# Patient Record
Sex: Female | Born: 1990 | Race: White | Hispanic: No | Marital: Single | Smoking: Never smoker
Health system: Southern US, Community
[De-identification: ages and names within clinical notes are randomized; demographics above are authoritative.]

## PROBLEM LIST (undated history)

## (undated) HISTORY — PX: OTHER SURGICAL HISTORY: SHX169

## (undated) HISTORY — PX: SHOULDER ARTHROSCOPY W/ ROTATOR CUFF REPAIR: SHX2400

---
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
## 2012-03-30 ENCOUNTER — Ambulatory Visit: Payer: Self-pay | Admitting: Radiology

## 2015-01-09 IMAGING — CR DG ANKLE COMPLETE 3+V*L*
1 series · 5 of 5 positions shown · non-contrast
Comparison: none

REASON FOR EXAM: lt ankle injury
COMMENTS:

[Series 1: ap · 0.17mm/px · 5 of 5 slices shown]
[im 1/5]
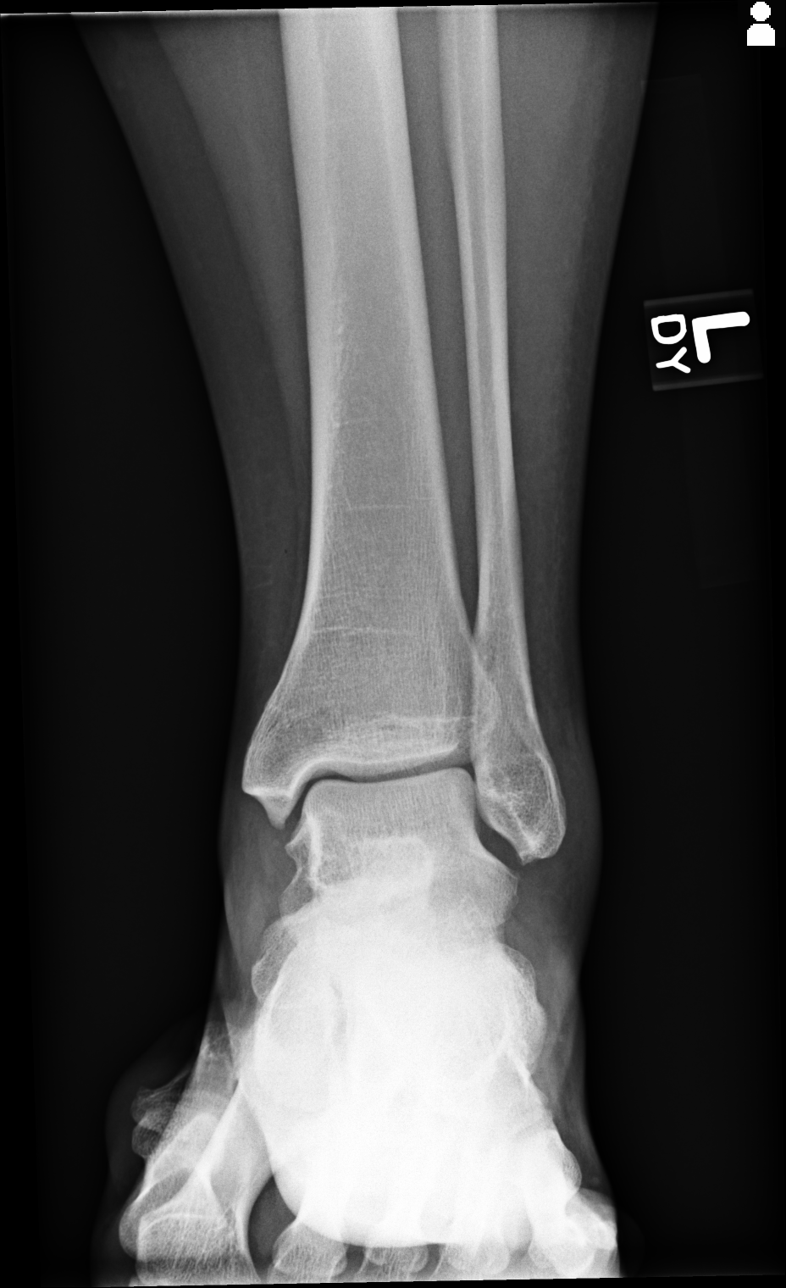
[im 2/5]
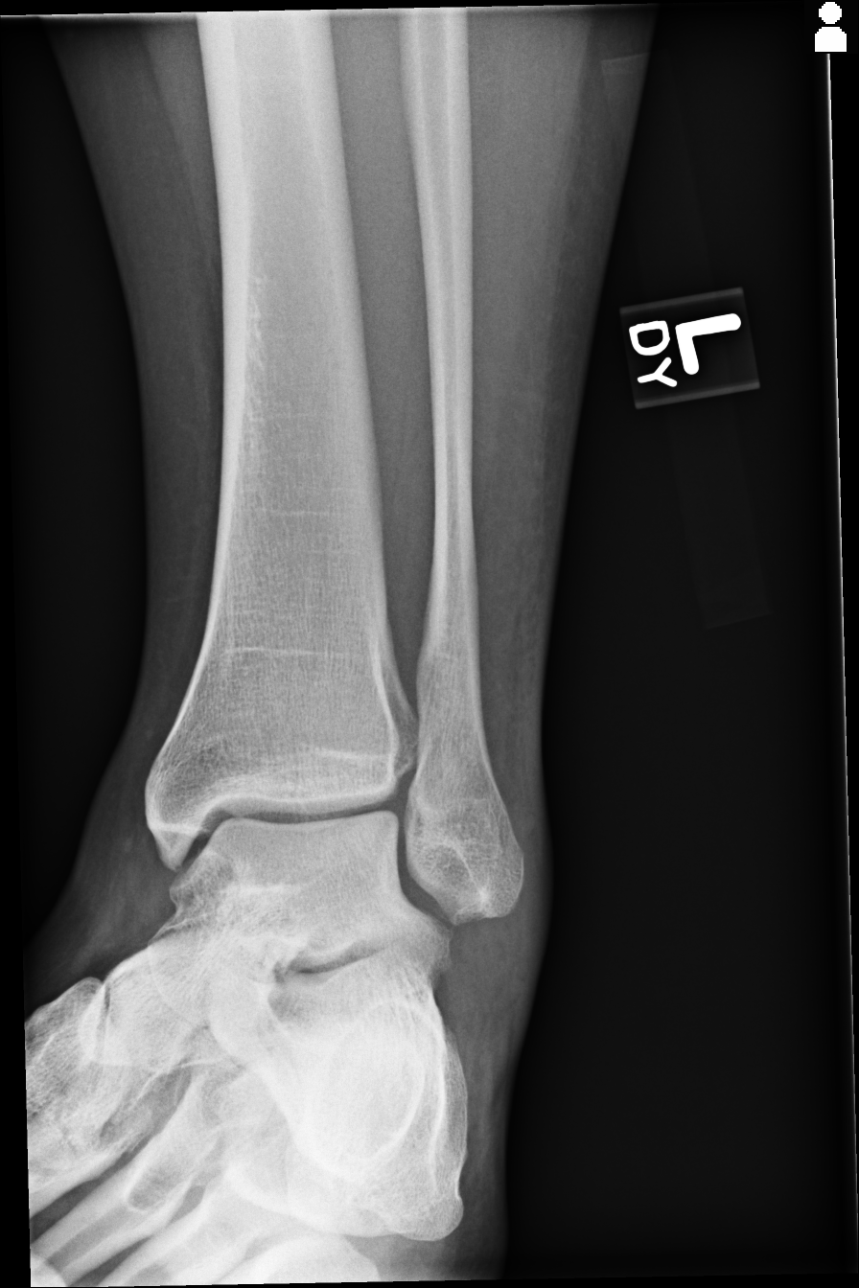
[im 3/5]
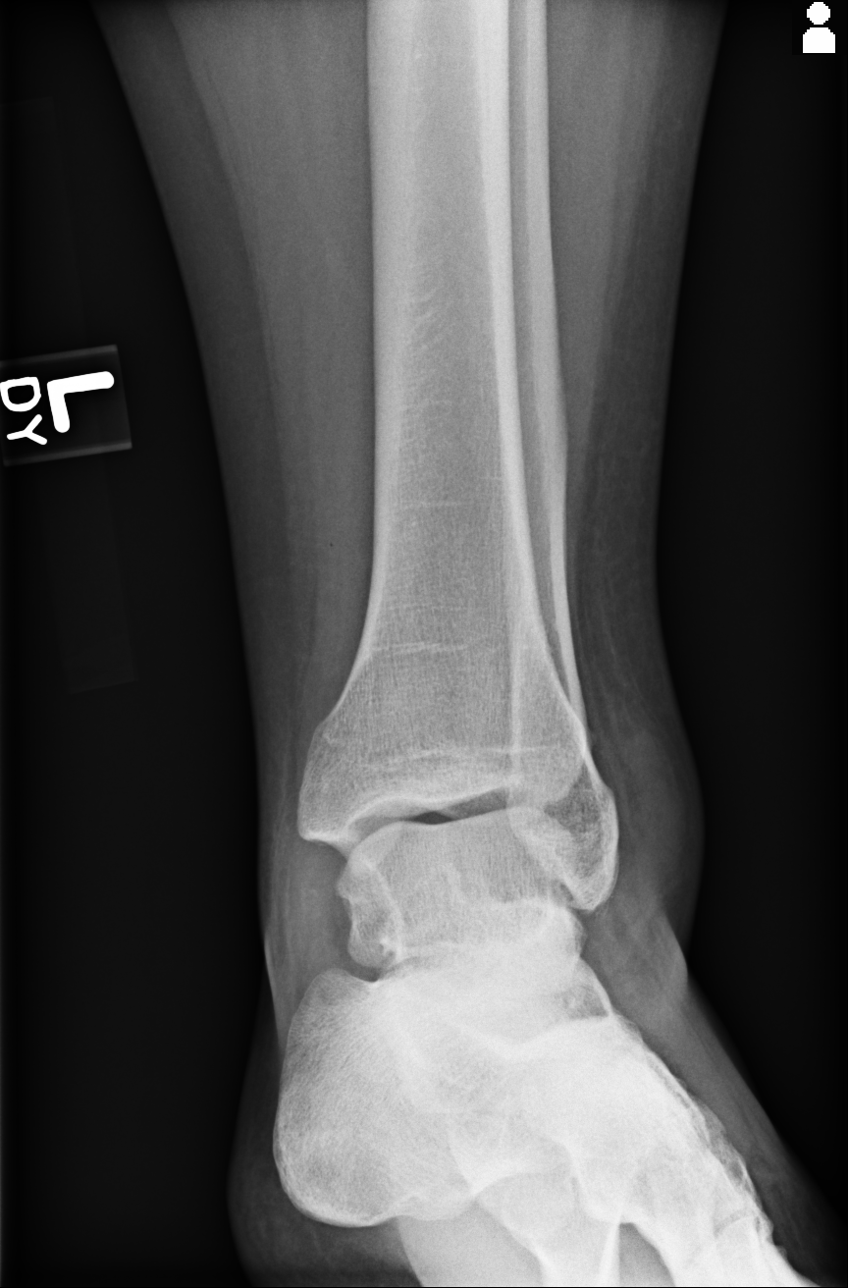
[im 4/5]
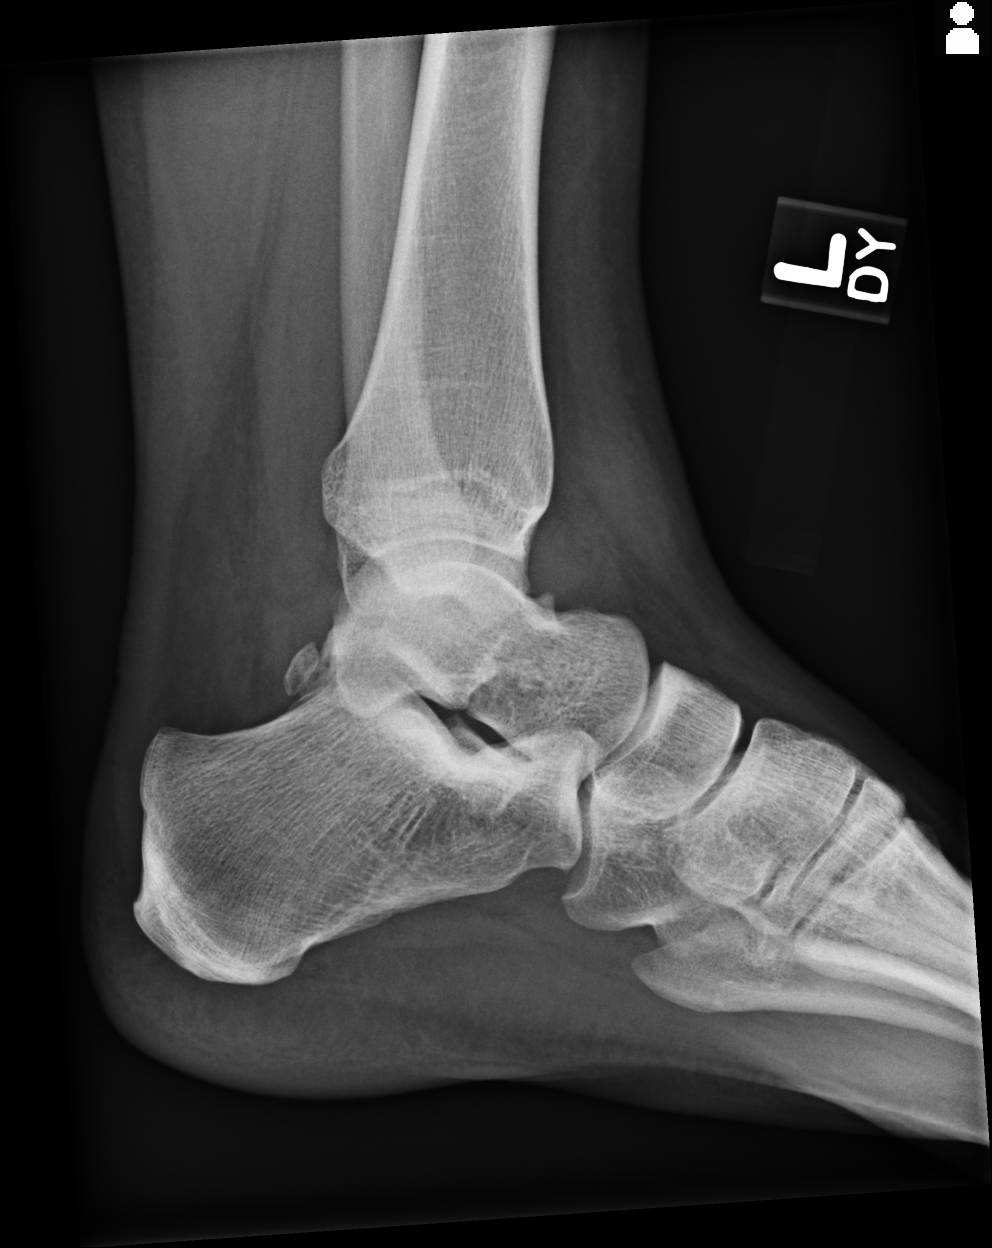
[im 5/5]
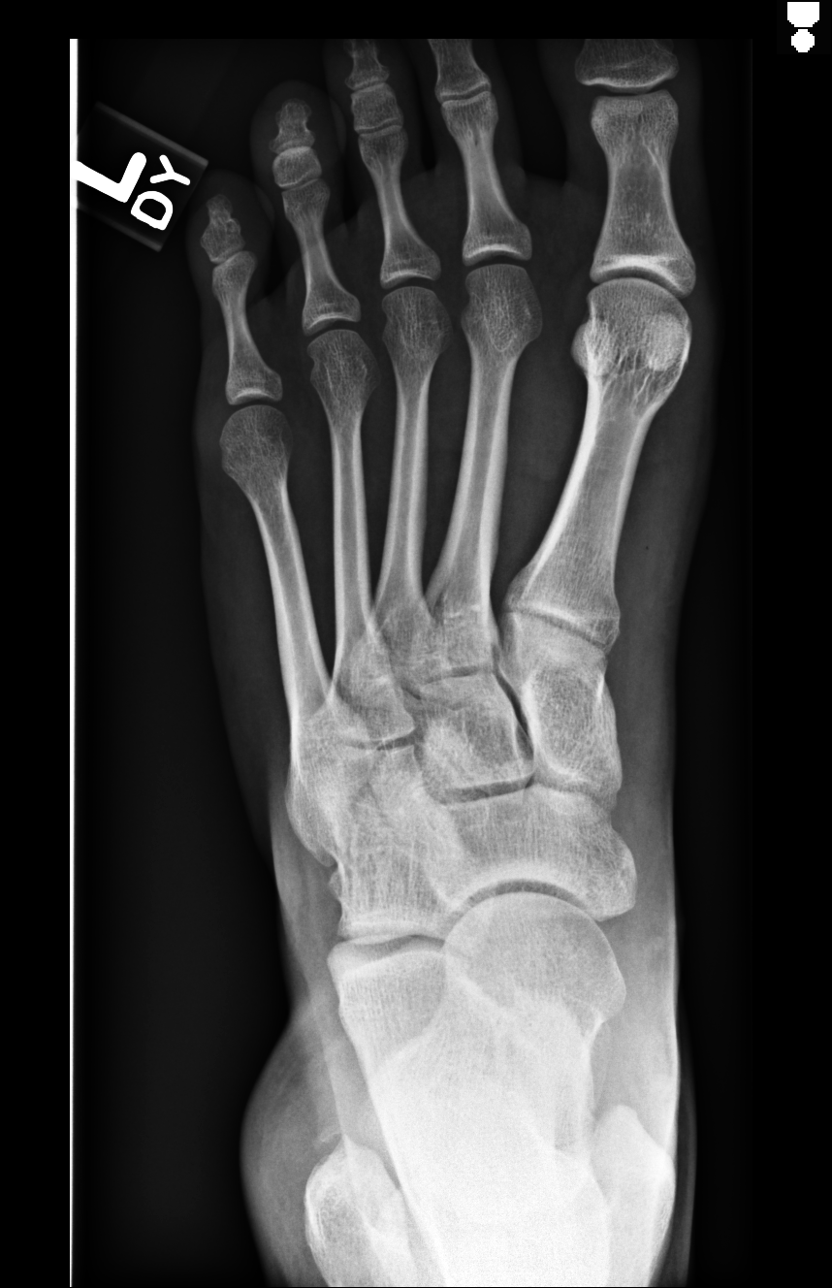

[5 of 5 positions shown; findings below may reference images not displayed]

PROCEDURE:     DXR - DXR ANKLE LEFT COMPLETE  - March 30, 2012  [DATE]

RESULT:     Images of the left ankle show significant soft tissue swelling
laterally. There is a tiny avulsion of the tip of the distal fibula which
may be chronic or acute. The distal tibia and fibula are otherwise
unremarkable. Talus and calcaneus appear normal. The base of the fifth
metatarsal appears intact.
IMPRESSION: Significant soft tissue swelling. Tiny avulsion at the tip
of the distal fibula is of uncertain chronicity.

[REDACTED]

## 2017-12-20 ENCOUNTER — Encounter: Payer: Self-pay | Admitting: Emergency Medicine

## 2017-12-20 ENCOUNTER — Other Ambulatory Visit: Payer: Self-pay

## 2017-12-20 ENCOUNTER — Ambulatory Visit
Admission: EM | Admit: 2017-12-20 | Discharge: 2017-12-20 | Disposition: A | Discharge status: Home / Self Care | Payer: BLUE CROSS/BLUE SHIELD | Attending: Family Medicine | Admitting: Family Medicine

## 2017-12-20 DIAGNOSIS — N390 Urinary tract infection, site not specified: Secondary | ICD-10-CM | POA: Diagnosis not present

## 2017-12-20 DIAGNOSIS — R319 Hematuria, unspecified: Secondary | ICD-10-CM | POA: Diagnosis not present

## 2017-12-20 DIAGNOSIS — R300 Dysuria: Secondary | ICD-10-CM | POA: Diagnosis not present

## 2017-12-20 DIAGNOSIS — R3915 Urgency of urination: Secondary | ICD-10-CM | POA: Diagnosis not present

## 2017-12-20 LAB — WET PREP, GENITAL
CLUE CELLS WET PREP: NONE SEEN
Sperm: NONE SEEN
Trich, Wet Prep: NONE SEEN
YEAST WET PREP: NONE SEEN

## 2017-12-20 LAB — URINALYSIS, COMPLETE (UACMP) WITH MICROSCOPIC
Bacteria, UA: NONE SEEN
Bilirubin Urine: NEGATIVE
Glucose, UA: NEGATIVE mg/dL
KETONES UR: NEGATIVE mg/dL
Nitrite: NEGATIVE
PH: 7 (ref 5.0–8.0)
PROTEIN: NEGATIVE mg/dL
SQUAMOUS EPITHELIAL / LPF: NONE SEEN (ref 0–5)
Specific Gravity, Urine: <1.005 — ABNORMAL LOW (ref 1.005–1.030)

## 2017-12-20 MED ORDER — PHENAZOPYRIDINE HCL 200 MG PO TABS: 200.0000 mg | ORAL_TABLET | Freq: Three times a day (TID) | ORAL | 0 refills | Status: AC

## 2017-12-20 MED ORDER — CEPHALEXIN 500 MG PO CAPS
500.0000 mg | ORAL_CAPSULE | Freq: Two times a day (BID) | ORAL | 0 refills | Status: AC
Start: 2017-12-20 — End: ?

## 2017-12-20 NOTE — ED Triage Notes (Addendum)
Patient in today c/o dysuria, urinary urgency and hematuria since this morning at ~4am. Patient denies fever. Patient denies vaginal discharge, but states she has had a yeast infection before, but has never had a UTI.

## 2017-12-20 NOTE — ED Provider Notes (Signed)
[facility]    CSN: [id number] Arrival date & time: 12/20/17  4132     History   Chief Complaint Chief Complaint  Patient presents with  . Dysuria    HPI Kayla Burch is a 27 y.o. female.   HPI  27 year old female presents with dysuria described as burning, urinary urgency and hematuria at 4:00 this morning. States that she sat on the toilet for long period of time unable to urinate but had the perception that she needed to go.  No fever or chills.  She  had no nausea or vomiting.  She is had no back pain.  Never had a UTI in the past.  She denies any vaginal symptoms.          History reviewed. No pertinent past medical history.  There are no active problems to display for this patient.   Past Surgical History:  Procedure Laterality Date  . SHOULDER ARTHROSCOPY W/ ROTATOR CUFF REPAIR Right   . tubes in ears      OB History   None      Home Medications    Prior to Admission medications   Medication Sig Start Date End Date Taking? Authorizing Provider  cephALEXin (KEFLEX) 500 MG capsule Take 1 capsule (500 mg total) by mouth 2 (two) times daily. 12/20/17   Lutricia Feil, PA-C  phenazopyridine (PYRIDIUM) 200 MG tablet Take 1 tablet (200 mg total) by mouth 3 (three) times daily. 12/20/17   Lutricia Feil, PA-C    Family History Family History  Problem Relation Age of Onset  . Other Mother        unknown medical history  . Atrial fibrillation Father   . Diabetes Maternal Grandfather   . Skin cancer Maternal Grandfather        SCC    Social History Social History   Tobacco Use  . Smoking status: Never Smoker  . Smokeless tobacco: Never Used  Substance Use Topics  . Alcohol use: Yes    Comment: rarely  . Drug use: Never     Allergies   Patient has no known allergies.   Review of Systems Review of Systems  Constitutional: Negative for activity change, appetite change, chills, fatigue and fever.  Genitourinary:  Positive for dysuria, frequency, hematuria and urgency. Negative for vaginal bleeding, vaginal discharge and vaginal pain.  All other systems reviewed and are negative.    Physical Exam Triage Vital Signs ED Triage Vitals  Enc Vitals Group     BP 12/20/17 0943 (!) 130/91     Pulse Rate 12/20/17 0943 91     Resp 12/20/17 0943 16     Temp 12/20/17 0943 98 F (36.7 C)     Temp Source 12/20/17 0943 Oral     SpO2 12/20/17 0943 99 %     Weight 12/20/17 0944 240 lb (108.9 kg)     Height 12/20/17 0944 5\' 10"  (1.778 m)     Head Circumference --      Peak Flow --      Pain Score 12/20/17 0944 0     Pain Loc --      Pain Edu? --      Excl. in GC? --    No data found.  Updated Vital Signs BP (!) 130/91 (BP Location: Left Arm)   Pulse 91   Temp 98 F (36.7 C) (Oral)   Resp 16   Ht 5\' 10"  (1.778 m)   Wt 240 lb (108.9 kg)  LMP 11/29/2017 (Approximate)   SpO2 99%   BMI 34.44 kg/m   Visual Acuity Right Eye Distance:   Left Eye Distance:   Bilateral Distance:    Right Eye Near:   Left Eye Near:    Bilateral Near:     Physical Exam  Constitutional: She is oriented to person, place, and time. She appears well-developed and well-nourished. No distress.  HENT:  Head: Normocephalic.  Eyes: Pupils are equal, round, and reactive to light.  Neck: Normal range of motion.  Genitourinary:  Genitourinary Comments: Patient performed a self swab  Musculoskeletal: Normal range of motion.  Neurological: She is alert and oriented to person, place, and time.  Skin: Skin is warm and dry. She is not diaphoretic.  Psychiatric: She has a normal mood and affect. Her behavior is normal. Judgment and thought content normal.  Nursing note and vitals reviewed.    UC Treatments / Results  Labs (all labs ordered are listed, but only abnormal results are displayed) Labs Reviewed  WET PREP, GENITAL - Abnormal; Notable for the following components:      Result Value   WBC, Wet Prep HPF POC RARE  (*)    All other components within normal limits  URINALYSIS, COMPLETE (UACMP) WITH MICROSCOPIC - Abnormal; Notable for the following components:   Color, Urine STRAW (*)    Specific Gravity, Urine <1.005 (*)    Hgb urine dipstick MODERATE (*)    Leukocytes, UA LARGE (*)    All other components within normal limits  URINE CULTURE    EKG None  Radiology No results found.  Procedures Procedures (including critical care time)  Medications Ordered in UC Medications - No data to display  Initial Impression / Assessment and Plan / UC Course  I have reviewed the triage vital signs and the nursing notes.  Pertinent labs & imaging results that were available during my care of the patient were reviewed by me and considered in my medical decision making (see chart for details).     Place patient on Keflex/ Pyridium.  Culture the urine which will be available in 48 hours.  Increase her fluid intake. Final Clinical Impressions(s) / UC Diagnoses   Final diagnoses:  Lower urinary tract infectious disease   Discharge Instructions   None    ED Prescriptions    Medication Sig Dispense Auth. Provider   cephALEXin (KEFLEX) 500 MG capsule Take 1 capsule (500 mg total) by mouth 2 (two) times daily. 10 capsule Ovid Curd P, PA-C   phenazopyridine (PYRIDIUM) 200 MG tablet Take 1 tablet (200 mg total) by mouth 3 (three) times daily. 6 tablet Lutricia Feil, PA-C     Controlled Substance Prescriptions [place] Controlled Substance Registry consulted? Not Applicable   Lutricia Feil, PA-C 12/20/17 1054

## 2017-12-22 LAB — URINE CULTURE

## 2020-02-25 ENCOUNTER — Other Ambulatory Visit: Payer: BLUE CROSS/BLUE SHIELD

## 2021-08-17 DIAGNOSIS — Z1321 Encounter for screening for nutritional disorder: Secondary | ICD-10-CM | POA: Diagnosis not present

## 2021-08-17 DIAGNOSIS — Z1329 Encounter for screening for other suspected endocrine disorder: Secondary | ICD-10-CM | POA: Diagnosis not present

## 2021-08-17 DIAGNOSIS — Z131 Encounter for screening for diabetes mellitus: Secondary | ICD-10-CM | POA: Diagnosis not present

## 2021-08-17 DIAGNOSIS — Z124 Encounter for screening for malignant neoplasm of cervix: Secondary | ICD-10-CM | POA: Diagnosis not present

## 2021-08-17 DIAGNOSIS — Z13220 Encounter for screening for lipoid disorders: Secondary | ICD-10-CM | POA: Diagnosis not present

## 2021-08-17 DIAGNOSIS — Z130 Encounter for screening for diseases of the blood and blood-forming organs and certain disorders involving the immune mechanism: Secondary | ICD-10-CM | POA: Diagnosis not present

## 2021-08-17 DIAGNOSIS — N898 Other specified noninflammatory disorders of vagina: Secondary | ICD-10-CM | POA: Diagnosis not present

## 2021-08-17 DIAGNOSIS — Z01419 Encounter for gynecological examination (general) (routine) without abnormal findings: Secondary | ICD-10-CM | POA: Diagnosis not present

## 2021-08-17 DIAGNOSIS — H9209 Otalgia, unspecified ear: Secondary | ICD-10-CM | POA: Diagnosis not present

## 2021-10-03 ENCOUNTER — Other Ambulatory Visit: Payer: Self-pay | Admitting: Otolaryngology

## 2021-10-03 DIAGNOSIS — H9313 Tinnitus, bilateral: Secondary | ICD-10-CM | POA: Diagnosis not present

## 2021-10-03 DIAGNOSIS — R599 Enlarged lymph nodes, unspecified: Secondary | ICD-10-CM | POA: Diagnosis not present

## 2021-10-03 DIAGNOSIS — K118 Other diseases of salivary glands: Secondary | ICD-10-CM

## 2021-10-03 DIAGNOSIS — H6063 Unspecified chronic otitis externa, bilateral: Secondary | ICD-10-CM | POA: Diagnosis not present

## 2021-10-03 DIAGNOSIS — H9203 Otalgia, bilateral: Secondary | ICD-10-CM | POA: Diagnosis not present

## 2021-10-20 ENCOUNTER — Ambulatory Visit
Admission: RE | Admit: 2021-10-20 | Discharge: 2021-10-20 | Disposition: A | Discharge status: Home / Self Care | Payer: 59 | Source: Ambulatory Visit | Attending: Otolaryngology | Admitting: Otolaryngology

## 2021-10-20 DIAGNOSIS — K118 Other diseases of salivary glands: Secondary | ICD-10-CM

## 2021-10-20 DIAGNOSIS — R221 Localized swelling, mass and lump, neck: Secondary | ICD-10-CM | POA: Diagnosis not present

## 2021-12-01 DIAGNOSIS — E538 Deficiency of other specified B group vitamins: Secondary | ICD-10-CM | POA: Diagnosis not present

## 2021-12-01 DIAGNOSIS — E559 Vitamin D deficiency, unspecified: Secondary | ICD-10-CM | POA: Diagnosis not present

## 2022-05-10 DIAGNOSIS — H6123 Impacted cerumen, bilateral: Secondary | ICD-10-CM | POA: Diagnosis not present

## 2022-05-10 DIAGNOSIS — H6063 Unspecified chronic otitis externa, bilateral: Secondary | ICD-10-CM | POA: Diagnosis not present

## 2023-01-23 DIAGNOSIS — R7989 Other specified abnormal findings of blood chemistry: Secondary | ICD-10-CM | POA: Diagnosis not present

## 2023-01-23 DIAGNOSIS — Z131 Encounter for screening for diabetes mellitus: Secondary | ICD-10-CM | POA: Diagnosis not present

## 2023-01-23 DIAGNOSIS — Z13220 Encounter for screening for lipoid disorders: Secondary | ICD-10-CM | POA: Diagnosis not present

## 2023-01-23 DIAGNOSIS — Z1321 Encounter for screening for nutritional disorder: Secondary | ICD-10-CM | POA: Diagnosis not present

## 2023-01-23 DIAGNOSIS — Z01411 Encounter for gynecological examination (general) (routine) with abnormal findings: Secondary | ICD-10-CM | POA: Diagnosis not present

## 2023-01-23 DIAGNOSIS — E88819 Insulin resistance, unspecified: Secondary | ICD-10-CM | POA: Diagnosis not present

## 2023-01-23 DIAGNOSIS — E559 Vitamin D deficiency, unspecified: Secondary | ICD-10-CM | POA: Diagnosis not present

## 2023-02-09 DIAGNOSIS — R102 Pelvic and perineal pain: Secondary | ICD-10-CM | POA: Diagnosis not present

## 2023-02-09 DIAGNOSIS — Z3009 Encounter for other general counseling and advice on contraception: Secondary | ICD-10-CM | POA: Diagnosis not present

## 2023-02-09 DIAGNOSIS — Z8049 Family history of malignant neoplasm of other genital organs: Secondary | ICD-10-CM | POA: Diagnosis not present

## 2023-08-03 ENCOUNTER — Ambulatory Visit
Admission: EM | Admit: 2023-08-03 | Discharge: 2023-08-03 | Disposition: A | Discharge status: Home / Self Care | Attending: Emergency Medicine | Admitting: Emergency Medicine

## 2023-08-03 DIAGNOSIS — B349 Viral infection, unspecified: Secondary | ICD-10-CM

## 2023-08-03 DIAGNOSIS — R0602 Shortness of breath: Secondary | ICD-10-CM

## 2023-08-03 DIAGNOSIS — R079 Chest pain, unspecified: Secondary | ICD-10-CM | POA: Diagnosis not present

## 2023-08-03 LAB — POC COVID19/FLU A&B COMBO
Covid Antigen, POC: NEGATIVE
Influenza A Antigen, POC: NEGATIVE
Influenza B Antigen, POC: NEGATIVE

## 2023-08-03 NOTE — ED Provider Notes (Signed)
 [facility]    CSN: [id number] Arrival date & time: 08/03/23  1930      History   Chief Complaint Chief Complaint  Patient presents with   Generalized Body Aches   Shortness of Breath   Nasal Congestion    HPI Kayla Burch is a 33 y.o. female.  Patient presents with fatigue, runny nose, congestion, postnasal drip, cough, chest tightness, shortness of breath, nausea since last night.  No fever, rash, vomiting, diarrhea.  Treatment with Tylenol and Aleve.  No pertinent medical history.  Patient mentions that her boyfriend has had tick bites; patient has not had any tick bites.  The history is provided by the patient and medical records.    History reviewed. No pertinent past medical history.  There are no active problems to display for this patient.   Past Surgical History:  Procedure Laterality Date   SHOULDER ARTHROSCOPY W/ ROTATOR CUFF REPAIR Right    tubes in ears      OB History   No obstetric history on file.      Home Medications    Prior to Admission medications   Medication Sig Start Date End Date Taking? Authorizing Provider  cephALEXin  (KEFLEX ) 500 MG capsule Take 1 capsule (500 mg total) by mouth 2 (two) times daily. Patient not taking: Reported on 08/03/2023 12/20/17   Foye Imperial P, PA-C  phenazopyridine  (PYRIDIUM ) 200 MG tablet Take 1 tablet (200 mg total) by mouth 3 (three) times daily. Patient not taking: Reported on 08/03/2023 12/20/17   Georgiann Kirsch, PA-C    Family History Family History  Problem Relation Age of Onset   Other Mother        unknown medical history   Atrial fibrillation Father    Diabetes Maternal Grandfather    Skin cancer Maternal Grandfather        SCC    Social History Social History   Tobacco Use   Smoking status: Never   Smokeless tobacco: Never  Vaping Use   Vaping status: Never Used  Substance Use Topics   Alcohol use: Yes    Comment: rarely   Drug use: Never     Allergies    Patient has no known allergies.   Review of Systems Review of Systems  Constitutional:  Positive for fatigue. Negative for chills and fever.  HENT:  Positive for congestion. Negative for ear pain and sore throat.   Respiratory:  Positive for cough and shortness of breath.   Gastrointestinal:  Positive for nausea. Negative for abdominal pain, diarrhea and vomiting.  Skin:  Negative for color change and rash.     Physical Exam Triage Vital Signs ED Triage Vitals [08/03/23 1933]  Encounter Vitals Group     BP      Systolic BP Percentile      Diastolic BP Percentile      Pulse      Resp      Temp      Temp src      SpO2      Weight      Height      Head Circumference      Peak Flow      Pain Score 8     Pain Loc      Pain Education      Exclude from Growth Chart    No data found.  Updated Vital Signs BP 131/81   Pulse 80   Temp 100 F (37.8 C)   Resp  18   LMP 07/20/2023   SpO2 98%   Visual Acuity Right Eye Distance:   Left Eye Distance:   Bilateral Distance:    Right Eye Near:   Left Eye Near:    Bilateral Near:     Physical Exam Constitutional:      General: She is not in acute distress. HENT:     Right Ear: Tympanic membrane normal.     Left Ear: Tympanic membrane normal.     Nose: Rhinorrhea present.     Mouth/Throat:     Mouth: Mucous membranes are moist.     Pharynx: Oropharynx is clear.  Cardiovascular:     Rate and Rhythm: Normal rate and regular rhythm.     Heart sounds: Normal heart sounds.  Pulmonary:     Effort: Pulmonary effort is normal. No respiratory distress.     Breath sounds: Normal breath sounds.  Abdominal:     General: Bowel sounds are normal.     Palpations: Abdomen is soft.     Tenderness: There is no abdominal tenderness. There is no guarding or rebound.  Neurological:     Mental Status: She is alert.      UC Treatments / Results  Labs (all labs ordered are listed, but only abnormal results are displayed) Labs  Reviewed  POC COVID19/FLU A&B COMBO    EKG   Radiology No results found.  Procedures Procedures (including critical care time)  Medications Ordered in UC Medications - No data to display  Initial Impression / Assessment and Plan / UC Course  I have reviewed the triage vital signs and the nursing notes.  Pertinent labs & imaging results that were available during my care of the patient were reviewed by me and considered in my medical decision making (see chart for details).   Viral illness, chest pain, shortness of breath.  Afebrile and vital signs are stable.  Lungs are clear and O2 sat is 98% on room air.  Discussed limitations of evaluation of chest tightness and shortness of breath in an urgent care setting.  Patient declines transfer to the ED.  She declines EKG.  Flu and COVID are negative.  Discussed symptomatic treatment including Tylenol or ibuprofen as needed, plain Mucinex as needed, rest, hydration.  Education provided on viral illness, nonspecific chest pain, shortness of breath.  Instructed patient to follow-up with her PCP on Monday.  Strict ED precautions discussed.  Patient also expressed concern because her boyfriend has had tick bites.  Patient has not had tick bites.  Reviewed symptoms to monitor for and education provided on tick bites.  She agrees to plan of care.  Final Clinical Impressions(s) / UC Diagnoses   Final diagnoses:  Viral illness  Chest pain, unspecified type  Shortness of breath     Discharge Instructions      Follow up with your primary care provider on Monday.  Go to the emergency department if you have persistent or worsening symptoms.    The COVID and flu tests are negative.   Take Tylenol or ibuprofen as needed for fever or discomfort.  Take plain Mucinex as needed for congestion.  Rest and keep yourself hydrated.           ED Prescriptions   None    PDMP not reviewed this encounter.   Wellington Half, NP 08/03/23 [id number]

## 2023-08-03 NOTE — ED Triage Notes (Signed)
 Patient to Urgent Care with complaints of nausea/ weakness/ shortness of breath/ runny nose/ congestion and drainage. No fevers.   Symptoms started last night. Started feeling worse today.   Taking tylenol and aleve (6pm).   Reports her partner has had several tick bites and there has been some ticks in her bed. No tick bites for her.

## 2023-08-03 NOTE — Discharge Instructions (Addendum)
 Follow up with your primary care provider on Monday.  Go to the emergency department if you have persistent or worsening symptoms.    The COVID and flu tests are negative.   Take Tylenol or ibuprofen as needed for fever or discomfort.  Take plain Mucinex as needed for congestion.  Rest and keep yourself hydrated.

## 2023-09-04 DIAGNOSIS — M722 Plantar fascial fibromatosis: Secondary | ICD-10-CM | POA: Diagnosis not present

## 2023-09-04 DIAGNOSIS — B351 Tinea unguium: Secondary | ICD-10-CM | POA: Diagnosis not present

## 2023-10-04 DIAGNOSIS — R053 Chronic cough: Secondary | ICD-10-CM | POA: Diagnosis not present

## 2023-10-05 DIAGNOSIS — Z3009 Encounter for other general counseling and advice on contraception: Secondary | ICD-10-CM | POA: Diagnosis not present

## 2023-10-05 DIAGNOSIS — R102 Pelvic and perineal pain: Secondary | ICD-10-CM | POA: Diagnosis not present

## 2023-10-05 DIAGNOSIS — Z8049 Family history of malignant neoplasm of other genital organs: Secondary | ICD-10-CM | POA: Diagnosis not present

## 2023-10-08 ENCOUNTER — Other Ambulatory Visit: Payer: Self-pay | Admitting: Specialist

## 2023-10-08 ENCOUNTER — Ambulatory Visit
Admission: RE | Admit: 2023-10-08 | Discharge: 2023-10-08 | Disposition: A | Discharge status: Home / Self Care | Source: Ambulatory Visit | Attending: Specialist | Admitting: Specialist

## 2023-10-08 DIAGNOSIS — J939 Pneumothorax, unspecified: Secondary | ICD-10-CM

## 2023-10-26 DIAGNOSIS — I288 Other diseases of pulmonary vessels: Secondary | ICD-10-CM | POA: Diagnosis not present

## 2023-10-26 DIAGNOSIS — R600 Localized edema: Secondary | ICD-10-CM | POA: Diagnosis not present

## 2023-10-26 DIAGNOSIS — R053 Chronic cough: Secondary | ICD-10-CM | POA: Diagnosis not present

## 2023-10-26 DIAGNOSIS — Z6839 Body mass index (BMI) 39.0-39.9, adult: Secondary | ICD-10-CM | POA: Diagnosis not present

## 2023-10-26 DIAGNOSIS — R0602 Shortness of breath: Secondary | ICD-10-CM | POA: Diagnosis not present

## 2023-11-19 DIAGNOSIS — R0602 Shortness of breath: Secondary | ICD-10-CM | POA: Diagnosis not present

## 2023-11-19 DIAGNOSIS — R600 Localized edema: Secondary | ICD-10-CM | POA: Diagnosis not present

## 2023-11-19 DIAGNOSIS — I288 Other diseases of pulmonary vessels: Secondary | ICD-10-CM | POA: Diagnosis not present

## 2023-12-04 DIAGNOSIS — R053 Chronic cough: Secondary | ICD-10-CM | POA: Diagnosis not present

## 2023-12-29 DIAGNOSIS — F411 Generalized anxiety disorder: Secondary | ICD-10-CM | POA: Diagnosis not present

## 2023-12-29 DIAGNOSIS — F5105 Insomnia due to other mental disorder: Secondary | ICD-10-CM | POA: Diagnosis not present

## 2023-12-29 DIAGNOSIS — F3181 Bipolar II disorder: Secondary | ICD-10-CM | POA: Diagnosis not present

## 2023-12-31 DIAGNOSIS — F3181 Bipolar II disorder: Secondary | ICD-10-CM | POA: Diagnosis not present

## 2024-01-08 DIAGNOSIS — F3181 Bipolar II disorder: Secondary | ICD-10-CM | POA: Diagnosis not present

## 2024-01-09 DIAGNOSIS — F411 Generalized anxiety disorder: Secondary | ICD-10-CM | POA: Diagnosis not present

## 2024-01-09 DIAGNOSIS — F3181 Bipolar II disorder: Secondary | ICD-10-CM | POA: Diagnosis not present

## 2024-01-11 DIAGNOSIS — F3181 Bipolar II disorder: Secondary | ICD-10-CM | POA: Diagnosis not present

## 2024-01-11 DIAGNOSIS — F411 Generalized anxiety disorder: Secondary | ICD-10-CM | POA: Diagnosis not present

## 2024-01-11 DIAGNOSIS — F5105 Insomnia due to other mental disorder: Secondary | ICD-10-CM | POA: Diagnosis not present

## 2024-01-12 DIAGNOSIS — G4733 Obstructive sleep apnea (adult) (pediatric): Secondary | ICD-10-CM | POA: Diagnosis not present

## 2024-01-28 DIAGNOSIS — Z01419 Encounter for gynecological examination (general) (routine) without abnormal findings: Secondary | ICD-10-CM | POA: Diagnosis not present

## 2024-01-28 DIAGNOSIS — Z30017 Encounter for initial prescription of implantable subdermal contraceptive: Secondary | ICD-10-CM | POA: Diagnosis not present

## 2024-01-28 DIAGNOSIS — Z1331 Encounter for screening for depression: Secondary | ICD-10-CM | POA: Diagnosis not present

## 2024-02-12 DIAGNOSIS — F3181 Bipolar II disorder: Secondary | ICD-10-CM | POA: Diagnosis not present

## 2024-02-12 DIAGNOSIS — I288 Other diseases of pulmonary vessels: Secondary | ICD-10-CM | POA: Diagnosis not present

## 2024-02-12 DIAGNOSIS — R053 Chronic cough: Secondary | ICD-10-CM | POA: Diagnosis not present

## 2024-02-12 DIAGNOSIS — F5105 Insomnia due to other mental disorder: Secondary | ICD-10-CM | POA: Diagnosis not present

## 2024-02-12 DIAGNOSIS — G4733 Obstructive sleep apnea (adult) (pediatric): Secondary | ICD-10-CM | POA: Diagnosis not present

## 2024-02-12 DIAGNOSIS — F411 Generalized anxiety disorder: Secondary | ICD-10-CM | POA: Diagnosis not present
